# Patient Record
Sex: Male | Born: 1985 | Race: Black or African American | Hispanic: No | Marital: Single | Smoking: Former smoker
Health system: Southern US, Community
[De-identification: ages and names within clinical notes are randomized; demographics above are authoritative.]

---
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
## 2005-12-20 ENCOUNTER — Emergency Department (HOSPITAL_COMMUNITY): Admission: EM | Admit: 2005-12-20 | Discharge: 2005-12-20 | Payer: Self-pay | Admitting: Emergency Medicine

## 2006-06-18 ENCOUNTER — Emergency Department (HOSPITAL_COMMUNITY): Admission: EM | Admit: 2006-06-18 | Discharge: 2006-06-18 | Payer: Self-pay | Admitting: Emergency Medicine

## 2007-01-04 ENCOUNTER — Emergency Department (HOSPITAL_COMMUNITY): Admission: EM | Admit: 2007-01-04 | Discharge: 2007-01-04 | Payer: Self-pay | Admitting: Family Medicine
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
## 2014-11-19 ENCOUNTER — Encounter (HOSPITAL_COMMUNITY): Payer: Self-pay | Admitting: Emergency Medicine

## 2014-11-19 ENCOUNTER — Emergency Department (HOSPITAL_COMMUNITY)
Admission: EM | Admit: 2014-11-19 | Discharge: 2014-11-19 | Disposition: A | Discharge status: Home / Self Care | Payer: Self-pay | Attending: Emergency Medicine | Admitting: Emergency Medicine

## 2014-11-19 DIAGNOSIS — Z720 Tobacco use: Secondary | ICD-10-CM | POA: Insufficient documentation

## 2014-11-19 DIAGNOSIS — K002 Abnormalities of size and form of teeth: Secondary | ICD-10-CM | POA: Insufficient documentation

## 2014-11-19 DIAGNOSIS — K088 Other specified disorders of teeth and supporting structures: Secondary | ICD-10-CM | POA: Insufficient documentation

## 2014-11-19 DIAGNOSIS — K029 Dental caries, unspecified: Secondary | ICD-10-CM | POA: Insufficient documentation

## 2014-11-19 MED ORDER — BUPIVACAINE-EPINEPHRINE (PF) 0.5% -1:200000 IJ SOLN
1.8000 mL | Freq: Once | INTRAMUSCULAR | Status: AC
  Administered 2014-11-19: 1.8 mL
  Filled 2014-11-19: qty 1.8

## 2014-11-19 MED ORDER — PENICILLIN V POTASSIUM 500 MG PO TABS: 500.0000 mg | ORAL_TABLET | Freq: Three times a day (TID) | ORAL | Status: AC

## 2014-11-19 MED ORDER — HYDROCODONE-ACETAMINOPHEN 5-325 MG PO TABS
1.0000 | ORAL_TABLET | Freq: Once | ORAL | Status: AC
  Administered 2014-11-19: 1 via ORAL
  Filled 2014-11-19: qty 1

## 2014-11-19 MED ORDER — HYDROCODONE-ACETAMINOPHEN 5-325 MG PO TABS: 1.0000 | ORAL_TABLET | Freq: Four times a day (QID) | ORAL | Status: AC | PRN

## 2014-11-19 MED ORDER — IBUPROFEN 800 MG PO TABS: 800.0000 mg | ORAL_TABLET | Freq: Three times a day (TID) | ORAL | Status: DC

## 2014-11-19 NOTE — ED Notes (Signed)
Patient transported to CT 

## 2014-11-19 NOTE — ED Notes (Signed)
Pt. reports left lower molar pain onset this week unrelieved by OTC pain medication .  

## 2014-11-19 NOTE — ED Provider Notes (Signed)
CSN: [id number]     Arrival date & time 11/19/14  2059 History  This chart was scribed for Fayrene Helper, PA-C, working with Donnetta Hutching, MD by Chestine Spore, ED Scribe. The patient was seen in room TR10C/TR10C at 10:32 PM.    Chief Complaint  Patient presents with  . Dental Pain      The history is provided by the patient. No language interpreter was used.    Peter Curry is a 29 y.o. male who presents to the Emergency Department complaining of worsening throbbing left lower dental pain onset 6 days. He reports that he has a hole in his left lower tooth that he has had for a few months. He notes that temperature changes worsen his symptoms. He states that has tried OTC medication with no relief for his symptoms. He denies sore throat, fever, chills, trouble swallowing, and any other symptoms. Pt denies allergies to any medications. Denies having a dentist and the pt is a current smoker.   History reviewed. No pertinent past medical history. History reviewed. No pertinent past surgical history. No family history on file. Social History  Substance Use Topics  . Smoking status: Current Every Day Smoker  . Smokeless tobacco: None  . Alcohol Use: No    Review of Systems  Constitutional: Negative for fever.  HENT: Positive for dental problem. Negative for sore throat and trouble swallowing.       Allergies  Shellfish allergy  Home Medications   Prior to Admission medications   Medication Sig Start Date End Date Taking? Authorizing Provider  Aspirin-Salicylamide-Caffeine (BC HEADACHE POWDER PO) Take 1 packet by mouth daily as needed.   Yes Historical Provider, MD  benzocaine (ORAJEL) 10 % mucosal gel Use as directed 1 application in the mouth or throat 2 (two) times daily as needed for mouth pain.   Yes Historical Provider, MD   BP 144/80 mmHg  Pulse 60  Temp(Src) 98.5 F (36.9 C) (Oral)  Resp 16  Ht 6\' 1"  (1.854 m)  Wt 175 lb (79.379 kg)  BMI 23.09 kg/m2  SpO2  99% Physical Exam  Constitutional: He is oriented to person, place, and time. He appears well-developed and well-nourished. No distress.  HENT:  Head: Normocephalic and atraumatic.  Mouth/Throat: Abnormal dentition. Dental caries present. No dental abscesses.  Tooth  #18 with obvious dental decay that is TTP. No obvious abscess noted. No gingival erythema.  Eyes: EOM are normal.  Neck: Neck supple.  Cardiovascular: Normal rate.   Pulmonary/Chest: Effort normal. No respiratory distress.  Abdominal: Soft. There is no tenderness.  Musculoskeletal: Normal range of motion.  Neurological: He is alert and oriented to person, place, and time.  Skin: Skin is warm and dry.  Psychiatric: He has a normal mood and affect. His behavior is normal.  Nursing note and vitals reviewed.   ED Course  NERVE BLOCK Date/Time: 11/19/2014 11:38 PM Performed by: Fayrene Helper Authorized by: Fayrene Helper Consent: Verbal consent obtained. Written consent not obtained. Risks and benefits: risks, benefits and alternatives were discussed Consent given by: patient Patient understanding: patient states understanding of the procedure being performed Patient identity confirmed: verbally with patient and arm band Indications: pain relief Body area: face/mouth Nerve: inferior alveolar Laterality: left Patient sedated: no Patient position: sitting Needle gauge: 27 G Location technique: anatomical landmarks Local anesthetic: bupivacaine 0.5% with epinephrine Anesthetic total: 1.8 ml Outcome: pain unchanged Patient tolerance: Patient tolerated the procedure well with no immediate complications   (including critical care time)  DIAGNOSTIC STUDIES: Oxygen Saturation is 99% on RA, nl by my interpretation.    COORDINATION OF CARE: 10:42 PM- I suspect that this is dental decay of tooth #19. Will treat with a dental block, antibiotic Rx, and referral to dentist. Discussed treatment plan with pt at bedside and pt agreed  to plan.     MDM   Final diagnoses:  Pain due to dental caries    BP 144/80 mmHg  Pulse 60  Temp(Src) 98.5 F (36.9 C) (Oral)  Resp 16  Ht  (1.854 m)  Wt 175 lb (79.379 kg)  BMI 23.09 kg/m2  SpO2 99%   I personally performed the services described in this documentation, which was scribed in my presence. The recorded information has been reviewed and is accurate.     Fayrene Helper, PA-C 11/19/14 0960  Donnetta Hutching, MD 11/20/14 1215

## 2014-11-19 NOTE — Discharge Instructions (Signed)

## 2015-08-04 ENCOUNTER — Ambulatory Visit (HOSPITAL_COMMUNITY)
Admission: EM | Admit: 2015-08-04 | Discharge: 2015-08-04 | Disposition: A | Discharge status: Home / Self Care | Payer: BLUE CROSS/BLUE SHIELD | Attending: Family Medicine | Admitting: Family Medicine

## 2015-08-04 ENCOUNTER — Encounter (HOSPITAL_COMMUNITY): Payer: Self-pay | Admitting: Emergency Medicine

## 2015-08-04 DIAGNOSIS — M542 Cervicalgia: Secondary | ICD-10-CM

## 2015-08-04 DIAGNOSIS — Z041 Encounter for examination and observation following transport accident: Secondary | ICD-10-CM

## 2015-08-04 MED ORDER — CYCLOBENZAPRINE HCL 5 MG PO TABS
5.0000 mg | ORAL_TABLET | Freq: Three times a day (TID) | ORAL | Status: AC | PRN
Start: 2015-08-04 — End: ?

## 2015-08-04 MED ORDER — IBUPROFEN 800 MG PO TABS: 800.0000 mg | ORAL_TABLET | Freq: Three times a day (TID) | ORAL | Status: AC
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
## 2019-06-19 ENCOUNTER — Other Ambulatory Visit: Payer: Self-pay

## 2019-06-19 ENCOUNTER — Ambulatory Visit (INDEPENDENT_AMBULATORY_CARE_PROVIDER_SITE_OTHER): Payer: Managed Care, Other (non HMO)

## 2019-06-19 ENCOUNTER — Ambulatory Visit (INDEPENDENT_AMBULATORY_CARE_PROVIDER_SITE_OTHER): Payer: Managed Care, Other (non HMO) | Admitting: Orthopaedic Surgery

## 2019-06-19 DIAGNOSIS — M25562 Pain in left knee: Secondary | ICD-10-CM

## 2019-06-19 DIAGNOSIS — M1712 Unilateral primary osteoarthritis, left knee: Secondary | ICD-10-CM | POA: Diagnosis not present

## 2019-06-19 NOTE — Progress Notes (Signed)
Office Visit Note   Patient: Peter Curry           Date of Birth: December 16, 1985           MRN: [id number] Visit Date: 06/19/2019              Requested by: No referring provider defined for this encounter. PCP: Patient, No Pcp Per   Assessment & Plan: Visit Diagnoses:  1. Left knee pain, unspecified chronicity   2. Unilateral primary osteoarthritis, left knee     Plan: I was able to aspirate about 20 to 30 cc of fluid from his knee.  I then placed a steroid injection in the left knee.  This did give him some relief.  I will still have him wear his knee sleeve since this did also provide some comfort.  He is a candidate for hyaluronic acid for his left knee given the arthritic findings were seeing in all 3 compartments on plain film.  Given the fact that he is only 34 years old we need to try all treatment modalities before considering a surgical intervention.  He agrees with this treatment plan.  All questions and concerns were answered and addressed.  We will work on ordering hyaluronic acid for his left knee and see him back in 4 weeks.  Follow-Up Instructions: Return in about 4 weeks (around 07/17/2019).   Orders:  Orders Placed This Encounter  Procedures  . Large Joint Inj  . XR Knee 1-2 Views Left   No orders of the defined types were placed in this encounter.     Procedures: Large Joint Inj: L knee on 06/19/2019 4:27 PM Indications: diagnostic evaluation and pain Details: 22 G 1.5 in needle, superolateral approach  Arthrogram: No  Medications: 3 mL lidocaine 1 %; 40 mg methylPREDNISolone acetate 40 MG/ML Outcome: tolerated well, no immediate complications Procedure, treatment alternatives, risks and benefits explained, specific risks discussed. Consent was given by the patient. Immediately prior to procedure a time out was called to verify the correct patient, procedure, equipment, support staff and site/side marked as required. Patient was prepped and draped in  the usual sterile fashion.       Clinical Data: No additional findings.   Subjective: Chief Complaint  Patient presents with  . Left Knee - Pain  The patient is a very pleasant 34 year old male who comes in with worsening left knee pain for several months now.  He reports a chronic injury to that knee that occurred back when he was very young.  He never had any type of surgery for that knee.  He does wear a knee brace because the knee does ache quite a bit.  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  He states that the left knee does swell for him.  He does not have any issues with his right knee.  He is not a diabetic and not a smoker.  He is a thin individual.  HPI  Review of Systems He currently denies any headache, chest pain, shortness of breath, fever, chills, nausea, vomiting  Objective: Vital Signs: There were no vitals taken for this visit.  Physical Exam He is alert and orient x3 and in no acute distress Ortho Exam When I compare both of his knees his left painful knee is slightly warm and does have a mild effusion.  When I start flexing past 90 degrees he hurts quite a bit in the posterior aspect of  his knee.  He does have significant medial joint line tenderness and quite significant patellofemoral crepitation.  He lacks full extension of the left knee by few degrees.  His right knee exam is entirely normal. Specialty Comments:  No specialty comments available.  Imaging: XR Knee 1-2 Views Left  Result Date: 06/19/2019 2 views of the left knee show significant arthritic changes with irregularities of the medial femoral condyle and the medial compartment of the knee.  There is significant osteophytes in the medial compartment as well as significant osteophytes around the patellofemoral joint.    PMFS History: Patient Active Problem List   Diagnosis Date Noted  . Unilateral primary osteoarthritis, left knee 06/19/2019   No  past medical history on file.  No family history on file.  No past surgical history on file. Social History   Occupational History  . Not on file  Tobacco Use  . Smoking status: Former Smoker  Substance and Sexual Activity  . Alcohol use: No  . Drug use: No  . Sexual activity: Not on file

## 2019-06-20 ENCOUNTER — Telehealth: Payer: Self-pay

## 2019-06-20 NOTE — Telephone Encounter (Signed)
Submitted for VOB for Synvisc One-Left knee °

## 2019-06-20 NOTE — Telephone Encounter (Signed)
Left knee gel injection ?

## 2019-06-21 ENCOUNTER — Telehealth: Payer: Self-pay

## 2019-06-21 NOTE — Telephone Encounter (Signed)
Patients insurance requires Prior Auth Submitted this today

## 2019-06-26 ENCOUNTER — Telehealth: Payer: Self-pay

## 2019-06-26 NOTE — Telephone Encounter (Signed)
Called pt and advised of Approval and Copay. He stated understanding and understands he will need to pay with card or check. Appointment was also moved up due to [place] dates

## 2019-06-26 NOTE — Telephone Encounter (Signed)
Approved for Synvisc One- Left knee Dr. Kathrin Greathouse and Bill $100 copay then covered @ 100% Prior auth required with [employer] # [id number] Dates: 06/21/19-07/12/19

## 2019-07-03 ENCOUNTER — Ambulatory Visit (INDEPENDENT_AMBULATORY_CARE_PROVIDER_SITE_OTHER): Payer: Managed Care, Other (non HMO) | Admitting: Orthopaedic Surgery

## 2019-07-03 ENCOUNTER — Other Ambulatory Visit: Payer: Self-pay

## 2019-07-03 ENCOUNTER — Encounter: Payer: Self-pay | Admitting: Orthopaedic Surgery

## 2019-07-03 DIAGNOSIS — M1712 Unilateral primary osteoarthritis, left knee: Secondary | ICD-10-CM | POA: Diagnosis not present

## 2019-07-03 MED ORDER — HYLAN G-F 20 48 MG/6ML IX SOSY
48.0000 mg | PREFILLED_SYRINGE | INTRA_ARTICULAR | Status: AC | PRN
  Administered 2019-07-03: 48 mg via INTRA_ARTICULAR
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
## 2019-07-17 ENCOUNTER — Ambulatory Visit: Payer: Managed Care, Other (non HMO) | Admitting: Orthopaedic Surgery

## 2019-09-25 ENCOUNTER — Ambulatory Visit: Payer: Managed Care, Other (non HMO) | Admitting: Family Medicine

## 2019-10-03 ENCOUNTER — Ambulatory Visit (INDEPENDENT_AMBULATORY_CARE_PROVIDER_SITE_OTHER): Payer: Managed Care, Other (non HMO)

## 2019-10-03 ENCOUNTER — Ambulatory Visit: Payer: Managed Care, Other (non HMO) | Admitting: Orthopaedic Surgery

## 2019-10-03 DIAGNOSIS — M25561 Pain in right knee: Secondary | ICD-10-CM

## 2019-10-03 DIAGNOSIS — M1711 Unilateral primary osteoarthritis, right knee: Secondary | ICD-10-CM

## 2019-10-03 MED ORDER — LIDOCAINE HCL 1 % IJ SOLN
3.0000 mL | INTRAMUSCULAR | Status: AC | PRN
  Administered 2019-10-03: 3 mL

## 2019-10-03 MED ORDER — METHYLPREDNISOLONE ACETATE 40 MG/ML IJ SUSP
40.0000 mg | INTRAMUSCULAR | Status: AC | PRN
  Administered 2019-10-03: 40 mg via INTRA_ARTICULAR
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
